# Patient Record
Sex: Male | Born: 2000 | Hispanic: No | Marital: Single | Smoking: Never smoker
Health system: Southern US, Community
[De-identification: ages and names within clinical notes are randomized; demographics above are authoritative.]

## PROBLEM LIST (undated history)

## (undated) ENCOUNTER — Ambulatory Visit (HOSPITAL_COMMUNITY): Admission: EM | Payer: Medicaid Other | Source: Home / Self Care

## (undated) DIAGNOSIS — Z9109 Other allergy status, other than to drugs and biological substances: Secondary | ICD-10-CM

## (undated) DIAGNOSIS — J45909 Unspecified asthma, uncomplicated: Secondary | ICD-10-CM

---
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
## 2022-01-22 ENCOUNTER — Encounter: Payer: Self-pay | Admitting: Emergency Medicine

## 2022-01-22 ENCOUNTER — Ambulatory Visit
Admission: EM | Admit: 2022-01-22 | Discharge: 2022-01-22 | Disposition: A | Discharge status: Home / Self Care | Payer: Medicaid Other | Attending: Family Medicine | Admitting: Family Medicine

## 2022-01-22 DIAGNOSIS — L03032 Cellulitis of left toe: Secondary | ICD-10-CM

## 2022-01-22 MED ORDER — CEPHALEXIN 500 MG PO CAPS: 500.0000 mg | ORAL_CAPSULE | Freq: Three times a day (TID) | ORAL | 0 refills | Status: DC

## 2022-01-22 MED ORDER — IBUPROFEN 800 MG PO TABS: 800.0000 mg | ORAL_TABLET | Freq: Three times a day (TID) | ORAL | 0 refills | Status: AC

## 2022-01-22 NOTE — Discharge Instructions (Addendum)
Take antibiotic 3 times a day May also take ibuprofen 3 times a day as needed for pain Warm soaks for 20 minutes 2 times a day Call for problems.  You may need podiatrist if surgery is necessary

## 2022-01-22 NOTE — ED Triage Notes (Signed)
Patient c/o left big toe pain, redness and swelling x 3 days.  Patient has taken Tylenol for pain.  Been applying warm water, ice and salt.

## 2022-01-22 NOTE — ED Provider Notes (Signed)
[facility]    CSN: [id number] Arrival date & time: 01/22/22  1858      History   Chief Complaint Chief Complaint  Patient presents with   Toe Pain    HPI Lee Duke is a 21 y.o. male.   HPI  Patient states his foot was stepped on almost a week ago in basketball.  Now he has a painful swollen big toe on the left.  He has been taking Tylenol for the pain.  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 No pertinent surgical history.     Home Medications    Prior to Admission medications   Medication Sig Start Date End Date Taking? Authorizing Provider  cephALEXin (KEFLEX) 500 MG capsule Take 1 capsule (500 mg total) by mouth 3 (three) times daily. 01/22/22  Yes Raylene Everts, MD  ibuprofen (ADVIL) 800 MG tablet Take 1 tablet (800 mg total) by mouth 3 (three) times daily. 01/22/22  Yes Raylene Everts, MD  acetaminophen (TYLENOL) 325 MG tablet Take 2 tablets (650 mg total) by mouth every 6 (six) hours as needed for fever. 07/17/15   Tomi Likens, PA-C  albuterol (PROVENTIL HFA;VENTOLIN HFA) 108 (90 BASE) MCG/ACT inhaler Inhale 1-2 puffs into the lungs every 6 (six) hours as needed for wheezing or shortness of breath. 02/15/15   Alvina Chou, PA-C    Family History History reviewed. No pertinent family history.  Social History Social History   Tobacco Use   Smoking status: Never  Substance Use Topics   Alcohol use: No   Drug use: No     Allergies   Patient has no known allergies.   Review of Systems Review of Systems  See HPI Physical Exam Triage Vital Signs ED Triage Vitals  Enc Vitals Group     BP 01/22/22 1908 (!) 115/59     Pulse Rate 01/22/22 1908 99     Resp 01/22/22 1908 18     Temp 01/22/22 1908 99 F (37.2 C)     Temp Source 01/22/22 1908 Oral     SpO2 01/22/22  1908 97 %     Weight 01/22/22 1909 113 lb (51.3 kg)     Height 01/22/22 1909 5\' 3"  (1.6 m)     Head Circumference --      Peak Flow --      Pain Score 01/22/22 1909 5     Pain Loc --      Pain Edu? --      Excl. in [place]? --    No data found.  Updated Vital Signs BP (!) 115/59 (BP Location: Left Arm)   Pulse 99   Temp 99 F (37.2 C) (Oral)   Resp 18   Ht 5\' 3"  (1.6 m)   Wt 51.3 kg   SpO2 97%   BMI 20.02 kg/m      Physical Exam Constitutional:      General: He is not in acute distress.    Appearance: Normal appearance. He is well-developed.  HENT:     Head: Normocephalic and atraumatic.  Eyes:     Conjunctiva/sclera: Conjunctivae normal.     Pupils: Pupils are equal, round, and reactive to light.  Cardiovascular:     Rate and Rhythm: Normal rate.  Pulmonary:     Effort: Pulmonary effort  is normal. No respiratory distress.  Abdominal:     General: There is no distension.     Palpations: Abdomen is soft.  Musculoskeletal:        General: Normal range of motion.     Cervical back: Normal range of motion.  Skin:    General: Skin is warm and dry.     Findings: Lesion present.     Comments: Great toe has some erythema and swelling, purulence at the nail edge, paronychia is present and very tender  Neurological:     General: No focal deficit present.     Mental Status: He is alert.  Psychiatric:        Mood and Affect: Mood normal.        Behavior: Behavior normal.      UC Treatments / Results  Labs (all labs ordered are listed, but only abnormal results are displayed) Labs Reviewed - No data to display  EKG   Radiology No results found.  Procedures Procedures (including critical care time)  Medications Ordered in UC Medications - No data to display  Initial Impression / Assessment and Plan / UC Course  I have reviewed the triage vital signs and the nursing notes.  Pertinent labs & imaging results that were available during my care of the patient  were reviewed by me and considered in my medical decision making (see chart for details).     Discussed treatment.  Podiatry follow-up if fails to improve Final Clinical Impressions(s) / UC Diagnoses   Final diagnoses:  Paronychia of great toe of left foot     Discharge Instructions      Take antibiotic 3 times a day May also take ibuprofen 3 times a day as needed for pain Warm soaks for 20 minutes 2 times a day Call for problems.  You may need podiatrist if surgery is necessary   ED Prescriptions     Medication Sig Dispense Auth. Provider   cephALEXin (KEFLEX) 500 MG capsule Take 1 capsule (500 mg total) by mouth 3 (three) times daily. 21 capsule Raylene Everts, MD   ibuprofen (ADVIL) 800 MG tablet Take 1 tablet (800 mg total) by mouth 3 (three) times daily. 21 tablet Raylene Everts, MD      PDMP not reviewed this encounter.   Raylene Everts, MD 01/22/22 2004

## 2022-09-15 ENCOUNTER — Other Ambulatory Visit: Payer: Self-pay

## 2022-09-15 ENCOUNTER — Ambulatory Visit
Admission: EM | Admit: 2022-09-15 | Discharge: 2022-09-15 | Disposition: A | Discharge status: Home / Self Care | Payer: BC Managed Care – PPO | Attending: Family Medicine | Admitting: Family Medicine

## 2022-09-15 DIAGNOSIS — L03032 Cellulitis of left toe: Secondary | ICD-10-CM | POA: Diagnosis not present

## 2022-09-15 MED ORDER — CEPHALEXIN 500 MG PO CAPS: 1000.0000 mg | ORAL_CAPSULE | Freq: Two times a day (BID) | ORAL | 0 refills | Status: AC

## 2022-09-15 NOTE — Discharge Instructions (Addendum)
Advised patient to take medication as directed with food to completion.  Encourage patient to increase daily water intake while taking this medication.  Advised if symptoms worsen and/or unresolved please follow-up with your PCP or [facility] for further evaluation.  [facility] contact provided with this AVS today.

## 2022-09-15 NOTE — ED Provider Notes (Signed)
[facility]    CSN: [id number] Arrival date & time: 09/15/22  1008      History   Chief Complaint Chief Complaint  Patient presents with   Toe Pain    LT great     HPI Lee Duke is a 22 y.o. male.   HPI 22 year old male presents with left great toe pain..  Patient reports was evaluated last September with similar symptoms.  Reports he graduated last week and wore dress shoes that may have aggravated sore of left great toe.  Patient requested cam walker boot for his left great toe pain today.  Patient is accompanied by his Father this morning.  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 No pertinent surgical history.     Home Medications    Prior to Admission medications   Medication Sig Start Date End Date Taking? Authorizing Provider  cephALEXin (KEFLEX) 500 MG capsule Take 2 capsules (1,000 mg total) by mouth 2 (two) times daily for 7 days. 09/15/22 09/22/22 Yes Trevor Iha, FNP  acetaminophen (TYLENOL) 325 MG tablet Take 2 tablets (650 mg total) by mouth every 6 (six) hours as needed for fever. 07/17/15   Eliseo Squires, PA-C  albuterol (PROVENTIL HFA;VENTOLIN HFA) 108 (90 BASE) MCG/ACT inhaler Inhale 1-2 puffs into the lungs every 6 (six) hours as needed for wheezing or shortness of breath. 02/15/15   Szekalski, Kaitlyn, PA-C  ibuprofen (ADVIL) 800 MG tablet Take 1 tablet (800 mg total) by mouth 3 (three) times daily. 01/22/22   Eustace Moore, MD    Family History History reviewed. No pertinent family history.  Social History Social History   Tobacco Use   Smoking status: Never  Substance Use Topics   Alcohol use: No   Drug use: No     Allergies   Patient has no known allergies.   Review of Systems Review of Systems  Skin:  Positive for wound.     Physical Exam Triage Vital  Signs ED Triage Vitals  Enc Vitals Group     BP 09/15/22 1028 114/74     Pulse Rate 09/15/22 1028 86     Resp 09/15/22 1028 17     Temp 09/15/22 1028 98.1 F (36.7 C)     Temp Source 09/15/22 1028 Oral     SpO2 09/15/22 1028 98 %     Weight --      Height --      Head Circumference --      Peak Flow --      Pain Score 09/15/22 1026 1     Pain Loc --      Pain Edu? --      Excl. in GC? --    No data found.  Updated Vital Signs BP 114/74 (BP Location: Left Arm)   Pulse 86   Temp 98.1 F (36.7 C) (Oral)   Resp 17   SpO2 98%      Physical Exam Vitals and nursing note reviewed.  Constitutional:      Appearance: Normal appearance.  HENT:     Head: Normocephalic and atraumatic.     Mouth/Throat:     Mouth: Mucous membranes are moist.     Pharynx: Oropharynx is clear.  Eyes:     Extraocular Movements: Extraocular movements intact.  Conjunctiva/sclera: Conjunctivae normal.     Pupils: Pupils are equal, round, and reactive to light.  Cardiovascular:     Rate and Rhythm: Normal rate and regular rhythm.     Pulses: Normal pulses.     Heart sounds: Normal heart sounds. No murmur heard. Pulmonary:     Effort: Pulmonary effort is normal.     Breath sounds: Normal breath sounds. No wheezing, rhonchi or rales.  Musculoskeletal:        General: Normal range of motion.     Cervical back: Normal range of motion and neck supple.  Skin:    General: Skin is warm and dry.     Comments: Left great toe (dorsum): Mildly erythematous medial nail border noted-please see image below  Neurological:     General: No focal deficit present.     Mental Status: He is alert and oriented to person, place, and time. Mental status is at baseline.  Psychiatric:        Mood and Affect: Mood normal.        Behavior: Behavior normal.      UC Treatments / Results  Labs (all labs ordered are listed, but only abnormal results are displayed) Labs Reviewed - No data to  display  EKG   Radiology No results found.  Procedures Procedures (including critical care time)  Medications Ordered in UC Medications - No data to display  Initial Impression / Assessment and Plan / UC Course  I have reviewed the triage vital signs and the nursing notes.  Pertinent labs & imaging results that were available during my care of the patient were reviewed by me and considered in my medical decision making (see chart for details).     MDM: 1.  Paronychia of great toe of left foot-Rx'd Keflex 1 g twice daily x 7 days.  [facility] podiatry follow-up if symptoms worsen. Advised patient to take medication as directed with food to completion.  Encourage patient to increase daily water intake while taking this medication.  Advised if symptoms worsen and/or unresolved please follow-up with your PCP or [facility] for further evaluation.  [facility] contact provided with this AVS today.  Patient discharged home, hemodynamically stable. Final Clinical Impressions(s) / UC Diagnoses   Final diagnoses:  Paronychia of great toe of left foot     Discharge Instructions      Advised patient to take medication as directed with food to completion.  Encourage patient to increase daily water intake while taking this medication.  Advised if symptoms worsen and/or unresolved please follow-up with your PCP or [facility] for further evaluation.  [facility] contact provided with this AVS today.     ED Prescriptions     Medication Sig Dispense Auth. Provider   cephALEXin (KEFLEX) 500 MG capsule Take 2 capsules (1,000 mg total) by mouth 2 (two) times daily for 7 days. 28 capsule Trevor Iha, FNP      PDMP not reviewed this encounter.   Trevor Iha, FNP 09/15/22 1146

## 2022-09-15 NOTE — ED Triage Notes (Signed)
Pt c/o LT great toe pain x 10 days.  Says he was seen for similar sxs last Sept. Says he graduated last week and wore dress shoes that may have aggravated his toe.
